# Patient Record
Sex: Male | Born: 2000
Health system: Southern US, Community
[De-identification: ages and names within clinical notes are randomized; demographics above are authoritative.]

## PROBLEM LIST (undated history)

## (undated) ENCOUNTER — Ambulatory Visit: Disposition: A | Discharge status: Home / Self Care | Payer: Medicaid Other

## (undated) DIAGNOSIS — A6000 Herpesviral infection of urogenital system, unspecified: Secondary | ICD-10-CM

---
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
## 2020-08-25 ENCOUNTER — Encounter (HOSPITAL_COMMUNITY): Payer: Self-pay | Admitting: *Deleted

## 2020-08-25 ENCOUNTER — Other Ambulatory Visit: Payer: Self-pay

## 2020-08-25 ENCOUNTER — Emergency Department (HOSPITAL_COMMUNITY)
Admission: EM | Admit: 2020-08-25 | Discharge: 2020-08-26 | Disposition: A | Discharge status: Home / Self Care | Payer: Medicaid Other | Attending: Emergency Medicine | Admitting: Emergency Medicine

## 2020-08-25 DIAGNOSIS — Z5321 Procedure and treatment not carried out due to patient leaving prior to being seen by health care provider: Secondary | ICD-10-CM | POA: Diagnosis not present

## 2020-08-25 DIAGNOSIS — R202 Paresthesia of skin: Secondary | ICD-10-CM | POA: Insufficient documentation

## 2020-08-25 DIAGNOSIS — M542 Cervicalgia: Secondary | ICD-10-CM | POA: Diagnosis present
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
## 2021-05-11 ENCOUNTER — Ambulatory Visit (HOSPITAL_COMMUNITY)
Admission: EM | Admit: 2021-05-11 | Discharge: 2021-05-11 | Disposition: A | Discharge status: Home / Self Care | Payer: Medicaid Other | Attending: Urgent Care | Admitting: Urgent Care

## 2021-05-11 ENCOUNTER — Ambulatory Visit (INDEPENDENT_AMBULATORY_CARE_PROVIDER_SITE_OTHER): Payer: Medicaid Other

## 2021-05-11 ENCOUNTER — Encounter (HOSPITAL_COMMUNITY): Payer: Self-pay

## 2021-05-11 DIAGNOSIS — R0781 Pleurodynia: Secondary | ICD-10-CM | POA: Diagnosis not present

## 2021-05-11 DIAGNOSIS — R1031 Right lower quadrant pain: Secondary | ICD-10-CM | POA: Diagnosis not present

## 2021-05-11 DIAGNOSIS — R0789 Other chest pain: Secondary | ICD-10-CM | POA: Diagnosis present

## 2021-05-11 DIAGNOSIS — R070 Pain in throat: Secondary | ICD-10-CM | POA: Insufficient documentation

## 2021-05-11 LAB — POCT RAPID STREP A, ED / UC: Streptococcus, Group A Screen (Direct): NEGATIVE

## 2021-05-11 MED ORDER — NAPROXEN 500 MG PO TABS: 500.0000 mg | ORAL_TABLET | Freq: Two times a day (BID) | ORAL | 0 refills | Status: DC

## 2021-05-11 MED ORDER — PHENOL 1.4 % MT LIQD: 1.0000 | Freq: Two times a day (BID) | OROMUCOSAL | 0 refills | Status: DC | PRN

## 2021-05-11 MED ORDER — TIZANIDINE HCL 4 MG PO TABS: 4.0000 mg | ORAL_TABLET | Freq: Every day | ORAL | 0 refills | Status: DC

## 2021-05-11 NOTE — ED Provider Notes (Signed)
[facility]   MRN: [id number] DOB: 07-Jan-2001  Subjective:   Henry Henderson is a 20 y.o. male presenting for 2-week history of persistent intermittent right lower rib pain.  Patient was playing basketball, ended up colliding with a player that hit him directly over this area.  His pain was initially severe and resolved for few days but returned in the past 2 to 3 days.  Pain is not elicited with taking a deep breath.  No fever, shortness of breath, bruising.  He has not used any medications consistently for this.  His mom did give him a muscle relaxer which helped some.  Yesterday he also started having chills and throat pain.  No current facility-administered medications for this encounter. No current outpatient medications on file.   No Known Allergies  History reviewed. No pertinent past medical history.   History reviewed. No pertinent surgical history.  History reviewed. No pertinent family history.  Social History   Tobacco Use   Smoking status: Never   Smokeless tobacco: Never  Substance Use Topics   Alcohol use: Yes   Drug use: Never    ROS   Objective:   Vitals: BP (!) 120/55 (BP Location: Right Arm)   Pulse 77   Temp 99.5 F (37.5 C) (Oral)   Resp 18   SpO2 99%   Physical Exam Constitutional:      General: He is not in acute distress.    Appearance: Normal appearance. He is well-developed. He is not ill-appearing, toxic-appearing or diaphoretic.  HENT:     Head: Normocephalic and atraumatic.     Right Ear: External ear normal.     Left Ear: External ear normal.     Nose: Nose normal.     Mouth/Throat:     Mouth: Mucous membranes are moist.     Pharynx: Oropharynx is clear. No pharyngeal swelling, oropharyngeal exudate, posterior oropharyngeal erythema or uvula swelling.  Eyes:     General: No scleral icterus.    Extraocular Movements: Extraocular movements intact.     Pupils: Pupils are equal, round, and reactive to light.   Cardiovascular:     Rate and Rhythm: Normal rate and regular rhythm.     Heart sounds: Normal heart sounds. No murmur heard.   No friction rub. No gallop.  Pulmonary:     Effort: Pulmonary effort is normal. No respiratory distress.     Breath sounds: Normal breath sounds. No stridor. No wheezing, rhonchi or rales.  Chest:     Chest wall: Tenderness (right lower-medial side) present.  Abdominal:     General: There is no distension.     Palpations: There is no mass.     Tenderness: no abdominal tenderness There is no guarding or rebound.  Neurological:     Mental Status: He is alert and oriented to person, place, and time.  Psychiatric:        Mood and Affect: Mood normal.        Behavior: Behavior normal.        Thought Content: Thought content normal.    Results for orders placed or performed during the hospital encounter of 05/11/21 (from the past 24 hour(s))  POCT Rapid Strep A     Status: None   Collection Time: 05/11/21  7:10 PM  Result Value Ref Range   Streptococcus, Group A Screen (Direct) NEGATIVE NEGATIVE    DG Ribs Unilateral W/Chest Right  Result Date: 05/11/2021 CLINICAL DATA:  Lower right rib pain  EXAM: RIGHT RIBS AND CHEST - 3+ VIEW COMPARISON:  None. FINDINGS: No fracture or other bone lesions are seen involving the ribs. There is no evidence of pneumothorax or pleural effusion. Both lungs are clear. Heart size and mediastinal contours are within normal limits. IMPRESSION: Negative. Electronically Signed   By: Charlett Nose M.D.   On: 05/11/2021 19:22     Assessment and Plan :   PDMP not reviewed this encounter.  1. Rib pain on right side   2. Chest wall pain   3. Throat pain     Recommended conservative management with NSAID, muscle relaxant.  Advised that he rest from sports for least a week.  Will use supportive care for his throat pain, suspect viral URI, viral pharyngitis.  Strep culture pending. Counseled patient on potential for adverse effects with  medications prescribed/recommended today, ER and return-to-clinic precautions discussed, patient verbalized understanding.    Wallis Bamberg, [place] 05/11/21 1931

## 2021-05-11 NOTE — ED Triage Notes (Addendum)
Pt in with c/o RUQ pain that started a few days ago after he was playing basketball. Pt also c/o ST  Pt states that when he breathes deeply he feels a sharp pain   Denies any N/v

## 2021-05-14 LAB — CULTURE, GROUP A STREP (THRC)

## 2021-05-19 ENCOUNTER — Emergency Department (HOSPITAL_COMMUNITY)
Admission: EM | Admit: 2021-05-19 | Discharge: 2021-05-20 | Disposition: A | Discharge status: Home / Self Care | Payer: Medicaid Other | Attending: Emergency Medicine | Admitting: Emergency Medicine

## 2021-05-19 DIAGNOSIS — W3400XA Accidental discharge from unspecified firearms or gun, initial encounter: Secondary | ICD-10-CM | POA: Diagnosis not present

## 2021-05-19 DIAGNOSIS — Z23 Encounter for immunization: Secondary | ICD-10-CM | POA: Diagnosis not present

## 2021-05-19 DIAGNOSIS — S81031A Puncture wound without foreign body, right knee, initial encounter: Secondary | ICD-10-CM | POA: Insufficient documentation

## 2021-05-19 DIAGNOSIS — S8991XA Unspecified injury of right lower leg, initial encounter: Secondary | ICD-10-CM | POA: Diagnosis present

## 2021-05-20 ENCOUNTER — Other Ambulatory Visit: Payer: Self-pay

## 2021-05-20 ENCOUNTER — Encounter (HOSPITAL_COMMUNITY): Payer: Self-pay | Admitting: Emergency Medicine

## 2021-05-20 ENCOUNTER — Encounter (HOSPITAL_COMMUNITY): Payer: Self-pay

## 2021-05-20 ENCOUNTER — Emergency Department (HOSPITAL_COMMUNITY): Payer: Medicaid Other

## 2021-05-20 LAB — COMPREHENSIVE METABOLIC PANEL
ALT: 18 U/L (ref 0–44)
AST: 23 U/L (ref 15–41)
Albumin: 4.3 g/dL (ref 3.5–5.0)
Alkaline Phosphatase: 50 U/L (ref 38–126)
Anion gap: 14 (ref 5–15)
BUN: 10 mg/dL (ref 6–20)
CO2: 18 mmol/L — ABNORMAL LOW (ref 22–32)
Calcium: 8.9 mg/dL (ref 8.9–10.3)
Chloride: 106 mmol/L (ref 98–111)
Creatinine, Ser: 1.25 mg/dL — ABNORMAL HIGH (ref 0.61–1.24)
GFR, Estimated: >60 mL/min (ref >60)
Glucose, Bld: 136 mg/dL — ABNORMAL HIGH (ref 70–99)
Potassium: 3.5 mmol/L (ref 3.5–5.1)
Sodium: 138 mmol/L (ref 135–145)
Total Bilirubin: 0.6 mg/dL (ref 0.3–1.2)
Total Protein: 6.9 g/dL (ref 6.5–8.1)

## 2021-05-20 LAB — URINALYSIS, ROUTINE W REFLEX MICROSCOPIC
Bilirubin Urine: NEGATIVE
Glucose, UA: NEGATIVE mg/dL
Hgb urine dipstick: NEGATIVE
Ketones, ur: NEGATIVE mg/dL
Leukocytes,Ua: NEGATIVE
Nitrite: NEGATIVE
Protein, ur: NEGATIVE mg/dL
Specific Gravity, Urine: 1.017 (ref 1.005–1.030)
pH: 7 (ref 5.0–8.0)

## 2021-05-20 LAB — CBC
HCT: 43.8 % (ref 39.0–52.0)
Hemoglobin: 14.3 g/dL (ref 13.0–17.0)
MCH: 31.3 pg (ref 26.0–34.0)
MCHC: 32.6 g/dL (ref 30.0–36.0)
MCV: 95.8 fL (ref 80.0–100.0)
Platelets: 322 10*3/uL (ref 150–400)
RBC: 4.57 MIL/uL (ref 4.22–5.81)
RDW: 13.1 % (ref 11.5–15.5)
WBC: 7.2 10*3/uL (ref 4.0–10.5)
nRBC: 0 % (ref 0.0–0.2)

## 2021-05-20 LAB — I-STAT CHEM 8, ED
BUN: 11 mg/dL (ref 6–20)
Calcium, Ion: 1.13 mmol/L — ABNORMAL LOW (ref 1.15–1.40)
Chloride: 107 mmol/L (ref 98–111)
Creatinine, Ser: 1.2 mg/dL (ref 0.61–1.24)
Glucose, Bld: 129 mg/dL — ABNORMAL HIGH (ref 70–99)
HCT: 45 % (ref 39.0–52.0)
Hemoglobin: 15.3 g/dL (ref 13.0–17.0)
Potassium: 3.5 mmol/L (ref 3.5–5.1)
Sodium: 142 mmol/L (ref 135–145)
TCO2: 19 mmol/L — ABNORMAL LOW (ref 22–32)

## 2021-05-20 LAB — SAMPLE TO BLOOD BANK

## 2021-05-20 MED ORDER — HYDROMORPHONE HCL 1 MG/ML IJ SOLN
1.0000 mg | Freq: Once | INTRAMUSCULAR | Status: AC
Start: 2021-05-20 — End: 2021-05-20
  Administered 2021-05-20: 1 mg via INTRAVENOUS
  Filled 2021-05-20: qty 1

## 2021-05-20 MED ORDER — CEFAZOLIN SODIUM-DEXTROSE 2-4 GM/100ML-% IV SOLN
2.0000 g | Freq: Once | INTRAVENOUS | Status: AC
  Administered 2021-05-20: 2 g via INTRAVENOUS
  Filled 2021-05-20: qty 100

## 2021-05-20 MED ORDER — ONDANSETRON HCL 4 MG/2ML IJ SOLN
4.0000 mg | Freq: Once | INTRAMUSCULAR | Status: AC | PRN
  Administered 2021-05-20: 4 mg via INTRAVENOUS
  Filled 2021-05-20: qty 2

## 2021-05-20 MED ORDER — TETANUS-DIPHTH-ACELL PERTUSSIS 5-2.5-18.5 LF-MCG/0.5 IM SUSY
0.5000 mL | PREFILLED_SYRINGE | Freq: Once | INTRAMUSCULAR | Status: AC
  Administered 2021-05-20: 0.5 mL via INTRAMUSCULAR
  Filled 2021-05-20: qty 0.5

## 2021-05-20 MED ORDER — IOHEXOL 350 MG/ML SOLN
100.0000 mL | Freq: Once | INTRAVENOUS | Status: AC | PRN
  Administered 2021-05-20: 100 mL via INTRAVENOUS

## 2021-05-20 MED ORDER — CEPHALEXIN 500 MG PO CAPS: 500.0000 mg | ORAL_CAPSULE | Freq: Four times a day (QID) | ORAL | 0 refills | Status: AC

## 2021-05-20 MED ORDER — OXYCODONE-ACETAMINOPHEN 5-325 MG PO TABS: 1.0000 | ORAL_TABLET | Freq: Four times a day (QID) | ORAL | 0 refills | Status: DC | PRN

## 2021-05-20 MED ORDER — SODIUM CHLORIDE 0.9 % IV BOLUS
2000.0000 mL | Freq: Once | INTRAVENOUS | Status: AC
  Administered 2021-05-20: 2000 mL via INTRAVENOUS
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
## 2021-05-20 NOTE — ED Notes (Signed)
Pt brother Daheem,[place] called
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
## 2021-05-20 NOTE — Discharge Instructions (Addendum)
Take Keflex as prescribed. You have been prescribed Percocet to take as needed for severe pain.  Do not drive or drink alcohol after taking this medication as it may make you drowsy and impair your judgment.    Avoid soaking your wound in stagnant or dirty water such as while taking a bath. You can shower normally. Keep the area clean with mild soap and warm water. Do not apply peroxide or alcohol to your wound as this can break down newly forming skin and prolong wound healing. Keep the area bandaged and change the dressing/bandage at least once per day. Have your staples/sutures removed in 7-10 days. You should return for wound recheck in 48 hours.
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
## 2021-05-25 ENCOUNTER — Encounter (HOSPITAL_COMMUNITY): Payer: Self-pay

## 2021-05-25 ENCOUNTER — Emergency Department (HOSPITAL_COMMUNITY)
Admission: EM | Admit: 2021-05-25 | Discharge: 2021-05-25 | Disposition: A | Discharge status: Home / Self Care | Payer: Medicaid Other | Attending: Emergency Medicine | Admitting: Emergency Medicine

## 2021-05-25 ENCOUNTER — Other Ambulatory Visit: Payer: Self-pay

## 2021-05-25 DIAGNOSIS — S81811D Laceration without foreign body, right lower leg, subsequent encounter: Secondary | ICD-10-CM | POA: Insufficient documentation

## 2021-05-25 DIAGNOSIS — W3400XD Accidental discharge from unspecified firearms or gun, subsequent encounter: Secondary | ICD-10-CM | POA: Diagnosis not present

## 2021-05-25 DIAGNOSIS — Z5189 Encounter for other specified aftercare: Secondary | ICD-10-CM
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
## 2021-05-25 NOTE — Discharge Instructions (Addendum)
Your wounds appear to be healing well, please continue the antibiotics that were prescribed to you.  I want you to rinse out the wounds 2 times daily and change the dressings.  You may apply antibiotic ointment over the area after each dressing change.  I wanted you to follow-up with wound care, given the contact of range above please call.  Want you to follow-up with a PCP.  Come back to the emergency department if you develop chest pain, shortness of breath, severe abdominal pain, uncontrolled nausea, vomiting, diarrhea.

## 2021-06-10 ENCOUNTER — Encounter (HOSPITAL_BASED_OUTPATIENT_CLINIC_OR_DEPARTMENT_OTHER): Payer: Medicaid Other | Attending: Internal Medicine | Admitting: Internal Medicine

## 2021-07-07 ENCOUNTER — Encounter (HOSPITAL_BASED_OUTPATIENT_CLINIC_OR_DEPARTMENT_OTHER): Payer: Medicaid Other | Attending: Internal Medicine | Admitting: Internal Medicine

## 2021-07-07 ENCOUNTER — Other Ambulatory Visit: Payer: Self-pay

## 2021-07-07 DIAGNOSIS — S81801A Unspecified open wound, right lower leg, initial encounter: Secondary | ICD-10-CM | POA: Insufficient documentation

## 2021-07-07 DIAGNOSIS — S81039A Puncture wound without foreign body, unspecified knee, initial encounter: Secondary | ICD-10-CM | POA: Diagnosis present

## 2021-07-12 NOTE — Progress Notes (Signed)
Henry Henderson ([id number]) Visit Report for 07/07/2021 Allergy List Details Patient Name: Date of Service: Henry Henderson [name] 07/07/2021 9:00 A M Medical Record Number: [id number] Patient Account Number: [id number] Date of Birth/Sex: Treating RN: [birth year] (20 y.o. Male) Fonnie Mu Primary Care Fowler Antos: PCP, NO Other Clinician: Referring Jozsef Wescoat: Treating Laiyah Exline/Extender: Tilda Franco in Treatment: 0 Allergies Active Allergies No Known Allergies Allergy Notes Electronic Signature(s) Signed: 07/12/2021 5:24:19 PM By: Fonnie Mu RN Entered By: Fonnie Mu on 07/07/2021 09:38:54 -------------------------------------------------------------------------------- Arrival Information Details Patient Name: Date of Service: Henry Henderson, O [name] 07/07/2021 9:00 A M Medical Record Number: [id number] Patient Account Number: [id number] Date of Birth/Sex: Treating RN: August 26, 2001 (20 y.o. Male) Fonnie Mu Primary Care Ajiah Mcglinn: PCP, NO Other Clinician: Referring Terrina Docter: Treating Sinahi Knights/Extender: Tilda Franco in Treatment: 0 Visit Information Patient Arrived: Ambulatory Arrival Time: 09:37 Accompanied By: sister Transfer Assistance: None Patient Identification Verified: Yes Secondary Verification Process Completed: Yes Patient Requires Transmission-Based Precautions: No Patient Has Alerts: No Electronic Signature(s) Signed: 07/07/2021 4:57:56 PM By: Zandra Abts RN, BSN Entered By: Zandra Abts on 07/07/2021 09:41:19 -------------------------------------------------------------------------------- Clinic Level of Care Assessment Details Patient Name: Date of Service: Henry Henderson [name] 07/07/2021 9:00 A M Medical Record Number: [id number] Patient Account Number: [id number] Date of Birth/Sex: Treating RN: September 17, 2001 (20 y.o. Male) Zandra Abts Primary Care Angelette Ganus: PCP, NO Other Clinician: Referring Demarques Pilz: Treating Keygan Dumond/Extender:  Tilda Franco in Treatment: 0 Clinic Level of Care Assessment Items TOOL 2 Quantity Score X- 1 0 Use when only an EandM is performed on the INITIAL visit ASSESSMENTS - Nursing Assessment / Reassessment X- 1 20 General Physical Exam (combine w/ comprehensive assessment (listed just below) when performed on new pt. evals) X- 1 25 Comprehensive Assessment (HX, ROS, Risk Assessments, Wounds Hx, etc.) ASSESSMENTS - Wound and Skin A ssessment / Reassessment []  - 0 Simple Wound Assessment / Reassessment - one wound X- 2 5 Complex Wound Assessment / Reassessment - multiple wounds []  - 0 Dermatologic / Skin Assessment (not related to wound area) ASSESSMENTS - Ostomy and/or Continence Assessment and Care []  - 0 Incontinence Assessment and Management []  - 0 Ostomy Care Assessment and Management (repouching, etc.) PROCESS - Coordination of Care X - Simple Patient / Family Education for ongoing care 1 15 []  - 0 Complex (extensive) Patient / Family Education for ongoing care X- 1 10 Staff obtains , Records, T Results / Process Orders est []  - 0 Staff telephones HHA, Nursing Homes / Clarify orders / etc []  - 0 Routine Transfer to another Facility (non-emergent condition) []  - 0 Routine Hospital Admission (non-emergent condition) X- 1 15 New Admissions / / Ordering NPWT Apligraf, etc. , []  - 0 Emergency Hospital Admission (emergent condition) X- 1 10 Simple Discharge Coordination []  - 0 Complex (extensive) Discharge Coordination PROCESS - Special Needs []  - 0 Pediatric / Minor Patient Management []  - 0 Isolation Patient Management []  - 0 Hearing / Language / Visual special needs []  - 0 Assessment of Community assistance (transportation, D/C planning, etc.) []  - 0 Additional assistance / Altered mentation []  - 0 Support Surface(s) Assessment (bed, cushion, seat, etc.) INTERVENTIONS - Wound Cleansing / Measurement X- 1 5 Wound  Imaging (photographs - any number of wounds) []  - 0 Wound Tracing (instead of photographs) []  - 0 Simple Wound Measurement - one wound X- 2 5 Complex Wound Measurement - multiple wounds []  - 0 Simple Wound Cleansing - one wound X- 2 5 Complex Wound Cleansing -  multiple wounds INTERVENTIONS - Wound Dressings []  - 0 Small Wound Dressing one or multiple wounds X- 1 15 Medium Wound Dressing one or multiple wounds []  - 0 Large Wound Dressing one or multiple wounds []  - 0 Application of Medications - injection INTERVENTIONS - Miscellaneous []  - 0 External ear exam []  - 0 Specimen Collection (cultures, biopsies, blood, body fluids, etc.) []  - 0 Specimen(s) / Culture(s) sent or taken to Lab for analysis []  - 0 Patient Transfer (multiple staff / Michiel SitesHoyer Lift / Similar devices) []  - 0 Simple Staple / Suture removal (25 or less) []  - 0 Complex Staple / Suture removal (26 or more) []  - 0 Hypo / Hyperglycemic Management (close monitor of Blood Glucose) []  - 0 Ankle / Brachial Index (ABI) - do not check if billed separately Has the patient been seen at the hospital within the last three years: Yes Total Score: 145 Level Of Care: New/Established - Level 4 Electronic Signature(s) Signed: 07/07/2021 4:57:56 PM By: Zandra AbtsLynch, Shatara RN, BSN Entered By: Zandra AbtsLynch, Shatara on 07/07/2021 09:59:11 -------------------------------------------------------------------------------- Encounter Discharge Information Details Patient Name: Date of Service: Henry Henderson, O [name] 07/07/2021 9:00 A M Medical Record Number: [id number] Patient Account Number: [id number] Date of Birth/Sex: Treating RN: 06/10/2001 (20 y.o. Male) Fonnie MuBreedlove, Lauren Primary Care Zakya Halabi: PCP, NO Other Clinician: Referring Trevon Strothers: Treating Marsi Turvey/Extender: Tilda FrancoHoffman, Jessica Weeks in Treatment: 0 Encounter Discharge Information Items Discharge Condition: Stable Ambulatory Status: Wheelchair Discharge Destination:  Home Transportation: Private Auto Accompanied By: self Schedule Follow-up Appointment: Yes Clinical Summary of Care: Patient Declined Electronic Signature(s) Signed: 07/12/2021 5:24:19 PM By: Fonnie MuBreedlove, Lauren RN Entered By: Fonnie MuBreedlove, Lauren on 07/07/2021 10:57:26 -------------------------------------------------------------------------------- Lower Extremity Assessment Details Patient Name: Date of Service: Henry Maryjean KaSSER, O [name] 07/07/2021 9:00 A M Medical Record Number: [id number] Patient Account Number: [id number] Date of Birth/Sex: Treating RN: 12/22/2000 (20 y.o. Male) Fonnie MuBreedlove, Lauren Primary Care Hannahgrace Lalli: PCP, NO Other Clinician: Referring Ladelle Teodoro: Treating Minh Roanhorse/Extender: Tilda FrancoHoffman, Jessica Weeks in Treatment: 0 Electronic Signature(s) Signed: 07/12/2021 5:24:19 PM By: Fonnie MuBreedlove, Lauren RN Signed: 07/12/2021 5:24:19 PM By: Fonnie MuBreedlove, Lauren RN Entered By: Fonnie MuBreedlove, Lauren on 07/07/2021 09:40:36 -------------------------------------------------------------------------------- Multi Wound Chart Details Patient Name: Date of Service: Henry Henderson, O [name] 07/07/2021 9:00 A M Medical Record Number: [id number] Patient Account Number: [id number] Date of Birth/Sex: Treating RN: 10/13/2001 (20 y.o. Male) Antonieta IbaBarnhart, Jodi Primary Care Malyah Ohlrich: PCP, NO Other Clinician: Referring Halcyon Heck: Treating Steen Bisig/Extender: Tilda FrancoHoffman, Jessica Weeks in Treatment: 0 Vital Signs Height(in): 72 Pulse(bpm): 79 Weight(lbs): Blood Pressure(mmHg): 119/74 Body Mass Index(BMI): Temperature(F): 98.5 Respiratory Rate(breaths/min): 16 Photos: [N/A:N/A] Right, Anterior Knee Right, Medial Knee N/A Wound Location: Trauma Trauma N/A Wounding Event: Trauma, Other Trauma, Other N/A Primary Etiology: 05/19/2021 05/19/2021 N/A Date Acquired: 0 0 N/A Weeks of Treatment: Open Open N/A Wound Status: 4.5x1x0.1 1x0.5x0.1 N/A Measurements L x W x D (cm) 3.534 0.393 N/A A (cm) : rea 0.353 0.039 N/A Volume  (cm) : N/A 0.00% N/A % Reduction in A rea: N/A 0.00% N/A % Reduction in Volume: Full Thickness Without Exposed Full Thickness Without Exposed N/A Classification: Support Structures Support Structures Medium Medium N/A Exudate Amount: Serosanguineous Serosanguineous N/A Exudate Type: red, brown red, brown N/A Exudate Color: Flat and Intact Flat and Intact N/A Wound Margin: Large (67-100%) Large (67-100%) N/A Granulation Amount: Red, Hyper-granulation Red, Hyper-granulation N/A Granulation Quality: None Present (0%) None Present (0%) N/A Necrotic Amount: Fat Layer (Subcutaneous Tissue): Yes Fat Layer (Subcutaneous Tissue): Yes N/A Exposed Structures: Fascia: No Fascia: No Tendon: No Tendon: No Muscle: No  Muscle: No Joint: No Joint: No Bone: No Bone: No None None N/A Epithelialization: Treatment Notes Electronic Signature(s) Signed: 07/07/2021 10:13:54 AM By: Geralyn Corwin DO Signed: 07/07/2021 5:23:24 PM By: Antonieta Iba Entered By: Geralyn Corwin on 07/07/2021 10:07:00 -------------------------------------------------------------------------------- Multi-Disciplinary Care Plan Details Patient Name: Date of Service: Henry Henderson, O [name] 07/07/2021 9:00 A M Medical Record Number: [id number] Patient Account Number: [id number] Date of Birth/Sex: Treating RN: 07-10-2001 (20 y.o. Male) Zandra Abts Primary Care Dashawna Delbridge: PCP, NO Other Clinician: Referring Houston Zapien: Treating Tawana Pasch/Extender: Tilda Franco in Treatment: 0 Multidisciplinary Care Plan reviewed with physician Active Inactive Wound/Skin Impairment Nursing Diagnoses: Impaired tissue integrity Knowledge deficit related to ulceration/compromised skin integrity Goals: Patient/caregiver will verbalize understanding of skin care regimen Date Initiated: 07/07/2021 Target Resolution Date: 08/05/2021 Goal Status: Active Ulcer/skin breakdown will have a volume reduction of 30% by week 4 Date  Initiated: 07/07/2021 Target Resolution Date: 08/05/2021 Goal Status: Active Interventions: Assess patient/caregiver ability to obtain necessary supplies Assess patient/caregiver ability to perform ulcer/skin care regimen upon admission and as needed Assess ulceration(s) every visit Provide education on ulcer and skin care Notes: Electronic Signature(s) Signed: 07/07/2021 4:57:56 PM By: Zandra Abts RN, BSN Entered By: Zandra Abts on 07/07/2021 09:58:11 -------------------------------------------------------------------------------- Pain Assessment Details Patient Name: Date of Service: Henry Henderson [name] 07/07/2021 9:00 A M Medical Record Number: [id number] Patient Account Number: [id number] Date of Birth/Sex: Treating RN: Mar 07, 2001 (20 y.o. Male) Zandra Abts Primary Care Achaia Garlock: PCP, NO Other Clinician: Referring Anakaren Campion: Treating Itzel Mckibbin/Extender: Tilda Franco in Treatment: 0 Active Problems Location of Pain Severity and Description of Pain Patient Has Paino No Site Locations Pain Management and Medication Current Pain Management: Electronic Signature(s) Signed: 07/07/2021 4:57:56 PM By: Zandra Abts RN, BSN Entered By: Zandra Abts on 07/07/2021 09:42:59 -------------------------------------------------------------------------------- Patient/Caregiver Education Details Patient Name: Date of Service: Henry Henderson [name] 8/25/2022andnbsp9:00 A M Medical Record Number: [id number] Patient Account Number: [id number] Date of Birth/Gender: Treating RN: 27-Dec-2000 (20 y.o. Male) Zandra Abts Primary Care Physician: PCP, NO Other Clinician: Referring Physician: Treating Physician/Extender: Tilda Franco in Treatment: 0 Education Assessment Education Provided To: Patient Education Topics Provided Wound/Skin Impairment: Methods: Explain/Verbal Responses: State content correctly Electronic Signature(s) Signed: 07/07/2021 4:57:56 PM By: Zandra Abts RN, BSN Entered By: Zandra Abts on 07/07/2021 09:58:19 -------------------------------------------------------------------------------- Wound Assessment Details Patient Name: Date of Service: Henry Henderson [name] 07/07/2021 9:00 A M Medical Record Number: [id number] Patient Account Number: [id number] Date of Birth/Sex: Treating RN: 02-27-2001 (20 y.o. Male) Antonieta Iba Primary Care Ruth Kovich: PCP, NO Other Clinician: Referring Kimani Hovis: Treating Keith Felten/Extender: Tilda Franco in Treatment: 0 Wound Status Wound Number: 1 Primary Etiology: Trauma, Other Wound Location: Right, Anterior Knee Wound Status: Open Wounding Event: Trauma Date Acquired: 05/19/2021 Weeks Of Treatment: 0 Clustered Wound: No Photos Wound Measurements Length: (cm) 4.5 Width: (cm) 1 Depth: (cm) 0.1 Area: (cm) 3.534 Volume: (cm) 0.353 % Reduction in Area: % Reduction in Volume: Epithelialization: None Tunneling: No Undermining: No Wound Description Classification: Full Thickness Without Exposed Support Structures Wound Margin: Flat and Intact Exudate Amount: Medium Exudate Type: Serosanguineous Exudate Color: red, brown Foul Odor After Cleansing: No Slough/Fibrino No Wound Bed Granulation Amount: Large (67-100%) Exposed Structure Granulation Quality: Red, Hyper-granulation Fascia Exposed: No Necrotic Amount: None Present (0%) Fat Layer (Subcutaneous Tissue) Exposed: Yes Tendon Exposed: No Muscle Exposed: No Joint Exposed: No Bone Exposed: No Treatment Notes Wound #1 (Knee) Wound Laterality: Right, Anterior Cleanser Soap and Water Discharge Instruction: May shower and wash wound with  dial antibacterial soap and water prior to dressing change. Peri-Wound Care Topical Primary Dressing Hydrofera Blue Ready Foam, 2.5 x2.5 in Discharge Instruction: Apply to wound bed as instructed Secondary Dressing Woven Gauze Sponge, Non-Sterile 4x4 in Discharge Instruction: Apply over  primary dressing as directed. ABD Pad, 5x9 Discharge Instruction: Apply over primary dressing as directed. Secured With Elastic Bandage 4 inch (ACE bandage) Discharge Instruction: Secure with ACE bandage as directed. Kerlix Roll Sterile, 4.5x3.1 (in/yd) Discharge Instruction: Secure with Kerlix as directed. Compression Wrap Compression Stockings Add-Ons Electronic Signature(s) Signed: 07/07/2021 4:57:56 PM By: Zandra Abts RN, BSN Signed: 07/07/2021 5:23:24 PM By: Antonieta Iba Entered By: Zandra Abts on 07/07/2021 09:50:33 -------------------------------------------------------------------------------- Wound Assessment Details Patient Name: Date of Service: Henry Henderson, O [name] 07/07/2021 9:00 A M Medical Record Number: [id number] Patient Account Number: [id number] Date of Birth/Sex: Treating RN: [birth year] (20 y.o. Male) Zandra Abts Primary Care Alphonso Gregson: PCP, NO Other Clinician: Referring Naol Ontiveros: Treating Standley Bargo/Extender: Tilda Franco in Treatment: 0 Wound Status Wound Number: 2 Primary Etiology: Trauma, Other Wound Location: Right, Medial Knee Wound Status: Open Wounding Event: Trauma Date Acquired: 05/19/2021 Weeks Of Treatment: 0 Clustered Wound: No Photos Wound Measurements Length: (cm) 1 Width: (cm) 0.5 Depth: (cm) 0.1 Area: (cm) 0.393 Volume: (cm) 0.039 % Reduction in Area: 0% % Reduction in Volume: 0% Epithelialization: None Tunneling: No Undermining: No Wound Description Classification: Full Thickness Without Exposed Support Structures Wound Margin: Flat and Intact Exudate Amount: Medium Exudate Type: Serosanguineous Exudate Color: red, brown Foul Odor After Cleansing: No Slough/Fibrino No Wound Bed Granulation Amount: Large (67-100%) Exposed Structure Granulation Quality: Red, Hyper-granulation Fascia Exposed: No Necrotic Amount: None Present (0%) Fat Layer (Subcutaneous Tissue) Exposed: Yes Tendon Exposed: No Muscle Exposed:  No Joint Exposed: No Bone Exposed: No Treatment Notes Wound #2 (Knee) Wound Laterality: Right, Medial Cleanser Soap and Water Discharge Instruction: May shower and wash wound with dial antibacterial soap and water prior to dressing change. Peri-Wound Care Topical Primary Dressing Hydrofera Blue Ready Foam, 2.5 x2.5 in Discharge Instruction: Apply to wound bed as instructed Secondary Dressing Woven Gauze Sponge, Non-Sterile 4x4 in Discharge Instruction: Apply over primary dressing as directed. ABD Pad, 5x9 Discharge Instruction: Apply over primary dressing as directed. Secured With Elastic Bandage 4 inch (ACE bandage) Discharge Instruction: Secure with ACE bandage as directed. Kerlix Roll Sterile, 4.5x3.1 (in/yd) Discharge Instruction: Secure with Kerlix as directed. Compression Wrap Compression Stockings Add-Ons Electronic Signature(s) Signed: 07/07/2021 4:57:56 PM By: Zandra Abts RN, BSN Signed: 07/11/2021 3:13:54 PM By: Karl Ito Entered By: Karl Ito on 07/07/2021 09:50:17 -------------------------------------------------------------------------------- Vitals Details Patient Name: Date of Service: Henry Henderson, O [name] 07/07/2021 9:00 A M Medical Record Number: [id number] Patient Account Number: [id number] Date of Birth/Sex: Treating RN: July 14, 2001 (20 y.o. Male) Fonnie Mu Primary Care Dashley Monts: PCP, NO Other Clinician: Referring Deshaun Schou: Treating Aynsley Fleet/Extender: Tilda Franco in Treatment: 0 Vital Signs Time Taken: 09:38 Temperature (F): 98.5 Height (in): 72 Pulse (bpm): 79 Source: Stated Respiratory Rate (breaths/min): 16 Blood Pressure (mmHg): 119/74 Reference Range: 80 - 120 mg / dl Electronic Signature(s) Signed: 07/07/2021 4:57:56 PM By: Zandra Abts RN, BSN Entered By: Zandra Abts on 07/07/2021 09:51:26
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
## 2021-07-21 ENCOUNTER — Ambulatory Visit: Payer: Medicaid Other | Attending: Internal Medicine | Admitting: Physical Therapy

## 2021-07-21 ENCOUNTER — Encounter (HOSPITAL_BASED_OUTPATIENT_CLINIC_OR_DEPARTMENT_OTHER): Payer: Medicaid Other | Attending: Internal Medicine | Admitting: Internal Medicine

## 2021-07-21 ENCOUNTER — Other Ambulatory Visit: Payer: Self-pay

## 2021-07-21 DIAGNOSIS — M25661 Stiffness of right knee, not elsewhere classified: Secondary | ICD-10-CM | POA: Insufficient documentation

## 2021-07-21 DIAGNOSIS — S81001A Unspecified open wound, right knee, initial encounter: Secondary | ICD-10-CM | POA: Insufficient documentation

## 2021-07-21 DIAGNOSIS — R262 Difficulty in walking, not elsewhere classified: Secondary | ICD-10-CM | POA: Insufficient documentation

## 2021-07-21 DIAGNOSIS — R600 Localized edema: Secondary | ICD-10-CM | POA: Insufficient documentation

## 2021-07-21 DIAGNOSIS — M25561 Pain in right knee: Secondary | ICD-10-CM | POA: Insufficient documentation

## 2021-07-21 DIAGNOSIS — S81801D Unspecified open wound, right lower leg, subsequent encounter: Secondary | ICD-10-CM

## 2021-07-21 NOTE — Progress Notes (Addendum)
RESHARD, GUILLET ([id number]) Visit Report for 07/21/2021 Arrival Information Details Patient Name: Date of Service: Henry Henderson RLA Henderson 07/21/2021 9:30 A M Medical Record Number: [id number] Patient Account Number: [id number] Date of Birth/Sex: Treating RN: 2000-12-12 (20 y.Henry. Charlean Merl, Lauren Primary Care Shametra Cumberland: PCP, NO Other Clinician: Referring Marquett Bertoli: Treating Christophere Hillhouse/Extender: Tilda Franco in Treatment: 2 Visit Information History Since Last Visit Added or deleted any medications: No Patient Arrived: Crutches Any new allergies or adverse reactions: No Arrival Time: 09:22 Had a fall or experienced change in No Accompanied By: sister activities of daily living that may affect Transfer Assistance: None risk of falls: Patient Identification Verified: Yes Signs or symptoms of abuse/neglect since last visito No Secondary Verification Process Completed: Yes Hospitalized since last visit: No Patient Requires Transmission-Based Precautions: No Implantable device outside of the clinic excluding No Patient Has Alerts: No cellular tissue based products placed in the center since last visit: Has Dressing in Place as Prescribed: Yes Pain Present Now: No Electronic Signature(s) Signed: 07/21/2021 6:08:32 PM By: Fonnie Mu RN Entered By: Fonnie Mu on 07/21/2021 09:22:55 -------------------------------------------------------------------------------- Clinic Level of Care Assessment Details Patient Name: Date of Service: Henry Henderson RLA Henderson 07/21/2021 9:30 A M Medical Record Number: [id number] Patient Account Number: [id number] Date of Birth/Sex: Treating RN: [birth year] (20 y.Henry. Charlean Merl, Lauren Primary Care Kerrick Miler: PCP, NO Other Clinician: Referring Fara Worthy: Treating Sindia Kowalczyk/Extender: Tilda Franco in Treatment: 2 Clinic Level of Care Assessment Items TOOL 4 Quantity Score X- 1 0 Use when only an EandM is performed on FOLLOW-UP  visit ASSESSMENTS - Nursing Assessment / Reassessment X- 1 10 Reassessment of Co-morbidities (includes updates in patient status) X- 1 5 Reassessment of Adherence to Treatment Plan ASSESSMENTS - Wound and Skin A ssessment / Reassessment X - Simple Wound Assessment / Reassessment - one wound 1 5 []  - 0 Complex Wound Assessment / Reassessment - multiple wounds X- 1 10 Dermatologic / Skin Assessment (not related to wound area) ASSESSMENTS - Focused Assessment []  - 0 Circumferential Edema Measurements - multi extremities []  - 0 Nutritional Assessment / Counseling / Intervention []  - 0 Lower Extremity Assessment (monofilament, tuning fork, pulses) []  - 0 Peripheral Arterial Disease Assessment (using hand held doppler) ASSESSMENTS - Ostomy and/or Continence Assessment and Care []  - 0 Incontinence Assessment and Management []  - 0 Ostomy Care Assessment and Management (repouching, etc.) PROCESS - Coordination of Care X - Simple Patient / Family Education for ongoing care 1 15 []  - 0 Complex (extensive) Patient / Family Education for ongoing care X- 1 10 Staff obtains , Records, T Results / Process Orders est []  - 0 Staff telephones HHA, Nursing Homes / Clarify orders / etc []  - 0 Routine Transfer to another Facility (non-emergent condition) []  - 0 Routine Hospital Admission (non-emergent condition) []  - 0 New Admissions / / Ordering NPWT Apligraf, etc. , []  - 0 Emergency Hospital Admission (emergent condition) X- 1 10 Simple Discharge Coordination []  - 0 Complex (extensive) Discharge Coordination PROCESS - Special Needs []  - 0 Pediatric / Minor Patient Management []  - 0 Isolation Patient Management []  - 0 Hearing / Language / Visual special needs []  - 0 Assessment of Community assistance (transportation, D/C planning, etc.) []  - 0 Additional assistance / Altered mentation []  - 0 Support Surface(s) Assessment (bed, cushion, seat,  etc.) INTERVENTIONS - Wound Cleansing / Measurement X - Simple Wound Cleansing - one wound 1 5 []  - 0 Complex Wound Cleansing - multiple wounds X- 1  5 Wound Imaging (photographs - any number of wounds) []  - 0 Wound Tracing (instead of photographs) X- 1 5 Simple Wound Measurement - one wound []  - 0 Complex Wound Measurement - multiple wounds INTERVENTIONS - Wound Dressings X - Small Wound Dressing one or multiple wounds 1 10 []  - 0 Medium Wound Dressing one or multiple wounds []  - 0 Large Wound Dressing one or multiple wounds []  - 0 Application of Medications - topical []  - 0 Application of Medications - injection INTERVENTIONS - Miscellaneous []  - 0 External ear exam []  - 0 Specimen Collection (cultures, biopsies, blood, body fluids, etc.) []  - 0 Specimen(s) / Culture(s) sent or taken to Lab for analysis []  - 0 Patient Transfer (multiple staff / Nurse, adultHoyer Lift / Similar devices) []  - 0 Simple Staple / Suture removal (25 or less) []  - 0 Complex Staple / Suture removal (26 or more) []  - 0 Hypo / Hyperglycemic Management (close monitor of Blood Glucose) []  - 0 Ankle / Brachial Index (ABI) - do not check if billed separately X- 1 5 Vital Signs Has the patient been seen at the hospital within the last three years: Yes Total Score: 95 Level Of Care: New/Established - Level 3 Electronic Signature(s) Signed: 07/21/2021 6:08:32 PM By: Fonnie MuBreedlove, Lauren RN Entered By: Fonnie MuBreedlove, Lauren on 07/21/2021 09:45:34 -------------------------------------------------------------------------------- Encounter Discharge Information Details Patient Name: Date of Service: Henry Arne ClevelandSSER, Henry Henderson 07/21/2021 9:30 A M Medical Record Number: [id number] Patient Account Number: [id number] Date of Birth/Sex: Treating RN: 02/15/2001 (20 y.Henry. Lucious GrovesM) Breedlove, Lauren Primary Care Candise Crabtree: PCP, NO Other Clinician: Referring Albert Hersch: Treating Mckay Tegtmeyer/Extender: Tilda FrancoHoffman, Jessica Weeks in Treatment: 2 Encounter  Discharge Information Items Discharge Condition: Stable Ambulatory Status: Crutches Discharge Destination: Home Transportation: Private Auto Accompanied By: self Schedule Follow-up Appointment: Yes Clinical Summary of Care: Patient Declined Electronic Signature(s) Signed: 07/21/2021 6:08:32 PM By: Fonnie MuBreedlove, Lauren RN Entered By: Fonnie MuBreedlove, Lauren on 07/21/2021 09:53:47 -------------------------------------------------------------------------------- Lower Extremity Assessment Details Patient Name: Date of Service: Henry Maryjean KaSSER, Henry Henderson 07/21/2021 9:30 A M Medical Record Number: [id number] Patient Account Number: [id number] Date of Birth/Sex: Treating RN: 09/26/2001 (20 y.Henry. Lucious GrovesM) Breedlove, Lauren Primary Care Norvel Wenker: PCP, NO Other Clinician: Referring Loleta Frommelt: Treating Hadley Soileau/Extender: Tilda FrancoHoffman, Jessica Weeks in Treatment: 2 Electronic Signature(s) Signed: 07/21/2021 6:08:32 PM By: Fonnie MuBreedlove, Lauren RN Entered By: Fonnie MuBreedlove, Lauren on 07/21/2021 09:24:53 -------------------------------------------------------------------------------- Multi Wound Chart Details Patient Name: Date of Service: Henry Arne ClevelandSSER, Henry Henderson 07/21/2021 9:30 A M Medical Record Number: [id number] Patient Account Number: [id number] Date of Birth/Sex: Treating RN: 04/20/2001 (20 y.Henry. Charlean MerlM) Breedlove, Lauren Primary Care Caidyn Blossom: PCP, NO Other Clinician: Referring Winfred Iiams: Treating Glennys Schorsch/Extender: Tilda FrancoHoffman, Jessica Weeks in Treatment: 2 Vital Signs Height(in): 72 Pulse(bpm): 60 Weight(lbs): Blood Pressure(mmHg): 96/64 Body Mass Index(BMI): Temperature(F): 98.4 Respiratory Rate(breaths/min): 17 Photos: [N/A:N/A] Right, Anterior Knee Right, Medial Knee N/A Wound Location: Trauma Trauma N/A Wounding Event: Trauma, Other Trauma, Other N/A Primary Etiology: 05/19/2021 05/19/2021 N/A Date Acquired: 2 2 N/A Weeks of Treatment: Open Healed - Epithelialized N/A Wound Status: 0.2x0.2x0.1 0x0x0 N/A Measurements L x W x D  (cm) 0.031 0 N/A A (cm) : rea 0.003 0 N/A Volume (cm) : 99.10% 100.00% N/A % Reduction in A rea: 99.20% 100.00% N/A % Reduction in Volume: Full Thickness Without Exposed Full Thickness Without Exposed N/A Classification: Support Structures Support Structures Medium Medium N/A Exudate Amount: Serosanguineous Serosanguineous N/A Exudate Type: red, brown red, brown N/A Exudate Color: Flat and Intact Flat and Intact N/A Wound Margin: Large (67-100%) Large (67-100%) N/A Granulation  Amount: Red, Hyper-granulation Red, Hyper-granulation N/A Granulation Quality: None Present (0%) None Present (0%) N/A Necrotic Amount: Fat Layer (Subcutaneous Tissue): Yes Fat Layer (Subcutaneous Tissue): Yes N/A Exposed Structures: Fascia: No Fascia: No Tendon: No Tendon: No Muscle: No Muscle: No Joint: No Joint: No Bone: No Bone: No None None N/A Epithelialization: Treatment Notes Electronic Signature(s) Signed: 07/21/2021 10:00:13 AM By: Geralyn Corwin DO Signed: 07/21/2021 6:08:32 PM By: Fonnie Mu RN Entered By: Geralyn Corwin on 07/21/2021 09:51:05 -------------------------------------------------------------------------------- Multi-Disciplinary Care Plan Details Patient Name: Date of Service: Henry Henderson, Henry Henderson 07/21/2021 9:30 A M Medical Record Number: [id number] Patient Account Number: [id number] Date of Birth/Sex: Treating RN: [birth year] (20 y.Henry. Lucious Groves Primary Care Kennley Schwandt: PCP, NO Other Clinician: Referring Baylee Campus: Treating Renelda Kilian/Extender: Tilda Franco in Treatment: 2 Multidisciplinary Care Plan reviewed with physician Active Inactive Electronic Signature(s) Signed: 12/01/2021 12:39:41 PM By: Fonnie Mu RN Previous Signature: 07/21/2021 6:08:32 PM Version By: Fonnie Mu RN Entered By: Fonnie Mu on 08/23/2021 09:48:57 -------------------------------------------------------------------------------- Pain Assessment  Details Patient Name: Date of Service: Henry Henderson 07/21/2021 9:30 A M Medical Record Number: [id number] Patient Account Number: [id number] Date of Birth/Sex: Treating RN: May 13, 2001 (20 y.Henry. Lucious Groves Primary Care Kassim Guertin: PCP, NO Other Clinician: Referring Keonia Pasko: Treating Yahayra Geis/Extender: Tilda Franco in Treatment: 2 Active Problems Location of Pain Severity and Description of Pain Patient Has Paino No Site Locations Pain Management and Medication Current Pain Management: Electronic Signature(s) Signed: 07/21/2021 6:08:32 PM By: Fonnie Mu RN Entered By: Fonnie Mu on 07/21/2021 09:25:01 -------------------------------------------------------------------------------- Patient/Caregiver Education Details Patient Name: Date of Service: Henry Henderson 9/8/2022andnbsp9:30 A M Medical Record Number: [id number] Patient Account Number: [id number] Date of Birth/Gender: Treating RN: [birth year] (20 y.Henry. Lucious Groves Primary Care Physician: PCP, NO Other Clinician: Referring Physician: Treating Physician/Extender: Tilda Franco in Treatment: 2 Education Assessment Education Provided To: Patient Education Topics Provided Wound/Skin Impairment: Methods: Explain/Verbal Responses: State content correctly Electronic Signature(s) Signed: 07/21/2021 6:08:32 PM By: Fonnie Mu RN Entered By: Fonnie Mu on 07/21/2021 09:37:12 -------------------------------------------------------------------------------- Wound Assessment Details Patient Name: Date of Service: Henry Henderson 07/21/2021 9:30 A M Medical Record Number: [id number] Patient Account Number: [id number] Date of Birth/Sex: Treating RN: 03/05/2001 (20 y.Henry. Charlean Merl, Lauren Primary Care Jossalin Chervenak: PCP, NO Other Clinician: Referring Huan Pollok: Treating Shruthi Northrup/Extender: Tilda Franco in Treatment: 2 Wound Status Wound Number: 1 Primary  Etiology: Trauma, Other Wound Location: Right, Anterior Knee Wound Status: Open Wounding Event: Trauma Date Acquired: 05/19/2021 Weeks Of Treatment: 2 Clustered Wound: No Photos Wound Measurements Length: (cm) 0.2 Width: (cm) 0.2 Depth: (cm) 0.1 Area: (cm) 0.031 Volume: (cm) 0.003 % Reduction in Area: 99.1% % Reduction in Volume: 99.2% Epithelialization: None Tunneling: No Undermining: No Wound Description Classification: Full Thickness Without Exposed Support Structures Wound Margin: Flat and Intact Exudate Amount: Medium Exudate Type: Serosanguineous Exudate Color: red, brown Foul Odor After Cleansing: No Slough/Fibrino No Wound Bed Granulation Amount: Large (67-100%) Exposed Structure Granulation Quality: Red, Hyper-granulation Fascia Exposed: No Necrotic Amount: None Present (0%) Fat Layer (Subcutaneous Tissue) Exposed: Yes Tendon Exposed: No Muscle Exposed: No Joint Exposed: No Bone Exposed: No Electronic Signature(s) Signed: 07/21/2021 6:08:32 PM By: Fonnie Mu RN Entered By: Fonnie Mu on 07/21/2021 09:31:52 -------------------------------------------------------------------------------- Wound Assessment Details Patient Name: Date of Service: Henry Henderson 07/21/2021 9:30 A M Medical Record Number: [id number] Patient Account Number: [id number] Date of Birth/Sex: Treating RN: 2001/09/12 (20 y.Henry. Lucious Groves Primary Care Norena Bratton: PCP, NO  Other Clinician: Referring Mallarie Voorhies: Treating Eidan Muellner/Extender: Tilda Franco in Treatment: 2 Wound Status Wound Number: 2 Primary Etiology: Trauma, Other Wound Location: Right, Medial Knee Wound Status: Healed - Epithelialized Wounding Event: Trauma Date Acquired: 05/19/2021 Weeks Of Treatment: 2 Clustered Wound: No Photos Wound Measurements Length: (cm) Width: (cm) Depth: (cm) Area: (cm) Volume: (cm) 0 % Reduction in Area: 100% 0 % Reduction in Volume: 100% 0  Epithelialization: None 0 Tunneling: No 0 Undermining: No Wound Description Classification: Full Thickness Without Exposed Support Structures Wound Margin: Flat and Intact Exudate Amount: Medium Exudate Type: Serosanguineous Exudate Color: red, brown Foul Odor After Cleansing: No Slough/Fibrino No Wound Bed Granulation Amount: Large (67-100%) Exposed Structure Granulation Quality: Red, Hyper-granulation Fascia Exposed: No Necrotic Amount: None Present (0%) Fat Layer (Subcutaneous Tissue) Exposed: Yes Tendon Exposed: No Muscle Exposed: No Joint Exposed: No Bone Exposed: No Treatment Notes Wound #2 (Knee) Wound Laterality: Right, Medial Cleanser Peri-Wound Care Topical Primary Dressing Secondary Dressing Secured With Compression Wrap Compression Stockings Add-Ons Electronic Signature(s) Signed: 07/21/2021 6:08:32 PM By: Fonnie Mu RN Entered By: Fonnie Mu on 07/21/2021 09:44:14 -------------------------------------------------------------------------------- Vitals Details Patient Name: Date of Service: Henry Henderson, Henry Henderson 07/21/2021 9:30 A M Medical Record Number: [id number] Patient Account Number: [id number] Date of Birth/Sex: Treating RN: [birth year] (20 y.Henry. Charlean Merl, Lauren Primary Care Neya Creegan: PCP, NO Other Clinician: Referring Bettye Sitton: Treating Yuki Brunsman/Extender: Tilda Franco in Treatment: 2 Vital Signs Time Taken: 09:24 Temperature (F): 98.4 Height (in): 72 Pulse (bpm): 60 Respiratory Rate (breaths/min): 17 Blood Pressure (mmHg): 96/64 Reference Range: 80 - 120 mg / dl Electronic Signature(s) Signed: 07/21/2021 6:08:32 PM By: Fonnie Mu RN Entered By: Fonnie Mu on 07/21/2021 09:24:45
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
## 2021-08-02 ENCOUNTER — Encounter: Payer: Self-pay | Admitting: Physical Therapy

## 2021-08-02 ENCOUNTER — Ambulatory Visit: Payer: Medicaid Other | Admitting: Physical Therapy

## 2021-08-02 ENCOUNTER — Other Ambulatory Visit: Payer: Self-pay

## 2021-08-02 DIAGNOSIS — M25661 Stiffness of right knee, not elsewhere classified: Secondary | ICD-10-CM | POA: Diagnosis not present

## 2021-08-02 DIAGNOSIS — R262 Difficulty in walking, not elsewhere classified: Secondary | ICD-10-CM | POA: Insufficient documentation

## 2021-08-02 DIAGNOSIS — R600 Localized edema: Secondary | ICD-10-CM

## 2021-08-02 DIAGNOSIS — M25561 Pain in right knee: Secondary | ICD-10-CM | POA: Insufficient documentation

## 2021-08-02 NOTE — Patient Instructions (Addendum)
     Access Code: [email or web address] URL: [email or web address] Date: 08/02/2021 Prepared by: Ruben Im  Exercises Hip and Knee Extension and Flexion Caregiver PROM - 1 x daily - 7 x weekly - 1 sets - 10 reps Seated Heel Slide - 1 x daily - 7 x weekly - 1 sets - 10 reps Sidelying Bent Knee Hip Flexion - 1 x daily - 7 x weekly - 1 sets - 10 reps Standing Knee Flexion Stretch on Step - 1 x daily - 7 x weekly - 1 sets - 10 reps   [facility] Welcome to [facility]! Here you will find all the information you will need regarding your pool therapy. If you have further questions at any time, please call our office at [phone]. After completing your initial evaluation in the [place] clinic, you may be eligible to complete a portion of your therapy in the pool. A typical week of therapy will consist of 1-2 typical physical therapy visits at our [place] location and an additional session of therapy in the pool located at the [facility] at [facility]. [address], [place]. The phone number at the pool site is [phone]. Please call this number if you are running late or need to cancel your appointment.  Aquatic therapy will be offered on Friday afternoons. Each session will last approximately 45 minutes. All scheduling and payments for aquatic therapy sessions, including cancelations, will be done through our [place] location.  To be eligible for aquatic therapy, these criteria must be met: You must be able to independently change in the locker room and get to the pool deck. A caregiver can come with you to help if needed. There are bleachers for a caregiver to sit on next to the pool. No one with an open wound is permitted in the pool.  Handicap parking is available in the front and there is a drop off option for even closer accessibility. Please arrive 15 minutes prior to your appointment to prepare for  your pool session. You must sign in at the front desk upon your arrival. Please be sure to attend to any toileting needs prior to entering the pool. [facility] rooms for changing are located to the right of the check-in desk. There is direct access to the pool deck from the locker room. You can lock your belongings in a locker but must bring a lock. Your therapist will greet you on the pool deck. There may be other swimmers in the pool at the same time but your session is one-on-one with the therapist.
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
## 2021-08-03 ENCOUNTER — Encounter: Payer: Self-pay | Admitting: Physical Therapy

## 2021-08-03 ENCOUNTER — Ambulatory Visit: Payer: Medicaid Other | Admitting: Physical Therapy

## 2021-08-03 DIAGNOSIS — M25661 Stiffness of right knee, not elsewhere classified: Secondary | ICD-10-CM | POA: Diagnosis not present

## 2021-08-03 DIAGNOSIS — R262 Difficulty in walking, not elsewhere classified: Secondary | ICD-10-CM

## 2021-08-03 DIAGNOSIS — M25561 Pain in right knee: Secondary | ICD-10-CM

## 2021-08-03 DIAGNOSIS — R600 Localized edema: Secondary | ICD-10-CM
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
## 2021-08-04 ENCOUNTER — Encounter (HOSPITAL_BASED_OUTPATIENT_CLINIC_OR_DEPARTMENT_OTHER): Payer: Medicaid Other | Admitting: Internal Medicine

## 2021-08-09 ENCOUNTER — Ambulatory Visit: Payer: Medicaid Other | Admitting: Physical Therapy

## 2021-08-12 ENCOUNTER — Encounter (HOSPITAL_BASED_OUTPATIENT_CLINIC_OR_DEPARTMENT_OTHER): Payer: Medicaid Other | Admitting: Internal Medicine

## 2021-08-12 ENCOUNTER — Ambulatory Visit: Payer: Medicaid Other | Admitting: Physical Therapy

## 2021-08-17 ENCOUNTER — Ambulatory Visit: Payer: Medicaid Other | Attending: Internal Medicine | Admitting: Physical Therapy

## 2021-08-17 DIAGNOSIS — R600 Localized edema: Secondary | ICD-10-CM | POA: Insufficient documentation

## 2021-08-17 DIAGNOSIS — M25661 Stiffness of right knee, not elsewhere classified: Secondary | ICD-10-CM | POA: Insufficient documentation

## 2021-08-17 DIAGNOSIS — M25561 Pain in right knee: Secondary | ICD-10-CM | POA: Insufficient documentation

## 2021-08-17 DIAGNOSIS — R262 Difficulty in walking, not elsewhere classified: Secondary | ICD-10-CM | POA: Insufficient documentation

## 2021-08-18 ENCOUNTER — Encounter (HOSPITAL_BASED_OUTPATIENT_CLINIC_OR_DEPARTMENT_OTHER): Payer: Medicaid Other | Admitting: Internal Medicine

## 2021-08-18 ENCOUNTER — Ambulatory Visit: Payer: Medicaid Other | Admitting: Physical Therapy

## 2021-08-24 ENCOUNTER — Encounter: Payer: Medicaid Other | Admitting: Physical Therapy

## 2021-08-26 ENCOUNTER — Ambulatory Visit: Payer: Medicaid Other | Admitting: Physical Therapy

## 2021-08-30 ENCOUNTER — Encounter: Payer: Medicaid Other | Admitting: Physical Therapy

## 2021-08-31 ENCOUNTER — Ambulatory Visit: Payer: Medicaid Other | Admitting: Physical Therapy

## 2021-09-06 ENCOUNTER — Encounter: Payer: Medicaid Other | Admitting: Physical Therapy

## 2022-02-17 ENCOUNTER — Ambulatory Visit (HOSPITAL_COMMUNITY): Payer: Self-pay

## 2022-02-17 ENCOUNTER — Emergency Department (HOSPITAL_COMMUNITY)
Admission: EM | Admit: 2022-02-17 | Discharge: 2022-02-17 | Discharge status: Against Medical Advice | Payer: Medicaid Other

## 2022-02-17 NOTE — ED Notes (Signed)
PT decided to leave AMA 14:35 ?

## 2022-03-13 ENCOUNTER — Encounter (HOSPITAL_COMMUNITY): Payer: Self-pay

## 2022-03-13 ENCOUNTER — Ambulatory Visit (HOSPITAL_COMMUNITY)
Admission: EM | Admit: 2022-03-13 | Discharge: 2022-03-13 | Disposition: A | Discharge status: Home / Self Care | Payer: Medicaid Other | Attending: Nurse Practitioner | Admitting: Nurse Practitioner

## 2022-03-13 DIAGNOSIS — N489 Disorder of penis, unspecified: Secondary | ICD-10-CM | POA: Insufficient documentation

## 2022-03-13 MED ORDER — VALACYCLOVIR HCL 1 G PO TABS: 1000.0000 mg | ORAL_TABLET | Freq: Two times a day (BID) | ORAL | 0 refills | Status: AC

## 2022-03-13 NOTE — ED Provider Notes (Addendum)
?[facility] ? ? ? ?CSN: [id number] ?Arrival date & time: 03/13/22  1548 ? ? ?  ? ?History   ?Chief Complaint ?Chief Complaint  ?Patient presents with  ? S74.5  ? ? ?HPI ?Henry Henderson is a 21 y.o. male.  ? ?Patient presents with 2-day history of bumps on his penis.  He reports they are painful and only itchy when he is sweating.  He denies any drainage from the bumps.  He is having some swelling in the right side of his groin.  He denies fevers, nausea/vomiting, dysuria, discharge from his penis.  He is currently sexually active.  Denies any known exposure to herpes virus. ? ? ?History reviewed. No pertinent past medical history. ? ?There are no problems to display for this patient. ? ? ?History reviewed. No pertinent surgical history. ? ? ? ? ?Home Medications   ? ?Prior to Admission medications   ?Medication Sig Start Date End Date Taking? Authorizing Provider  ?valACYclovir (VALTREX) 1000 MG tablet Take 1 tablet (1,000 mg total) by mouth 2 (two) times daily for 10 days. 03/13/22 03/23/22 Yes Valentino Nose, NP  ? ? ?Family History ?Family History  ?Problem Relation Age of Onset  ? Healthy Father   ? ? ?Social History ?Social History  ? ?Tobacco Use  ? Smoking status: Some Days  ?  Types: Cigars  ? Smokeless tobacco: Never  ?Substance Use Topics  ? Alcohol use: Yes  ? Drug use: Never  ? ? ? ?Allergies   ?Patient has no known allergies. ? ? ?Review of Systems ?Review of Systems ?Per HPI ? ?Physical Exam ?Triage Vital Signs ?ED Triage Vitals  ?Enc Vitals Group  ?   BP 03/13/22 1656 120/65  ?   Pulse Rate 03/13/22 1656 62  ?   Resp 03/13/22 1656 18  ?   Temp 03/13/22 1656 98.3 ?F (36.8 ?C)  ?   Temp Source 03/13/22 1656 Oral  ?   SpO2 03/13/22 1656 98 %  ?   Weight --   ?   Height --   ?   Head Circumference --   ?   Peak Flow --   ?   Pain Score 03/13/22 1655 2  ?   Pain Loc --   ?   Pain Edu? --   ?   Excl. in GC? --   ? ?No data found. ? ?Updated Vital Signs ?BP 120/65 (BP Location: Right Arm)   Pulse  62   Temp 98.3 ?F (36.8 ?C) (Oral)   Resp 18   SpO2 98%  ? ?Visual Acuity ?Right Eye Distance:   ?Left Eye Distance:   ?Bilateral Distance:   ? ?Right Eye Near:   ?Left Eye Near:    ?Bilateral Near:    ? ?Physical Exam ?Vitals and nursing note reviewed. Exam conducted with a chaperone present Eliezer Lofts, Char[employer] fundraiser).  ?Constitutional:   ?   General: He is not in acute distress. ?   Appearance: Normal appearance. He is not toxic-appearing.  ?Pulmonary:  ?   Effort: Pulmonary effort is normal. No respiratory distress.  ?Genitourinary: ?   Pubic Area: No rash.   ?   Penis: Circumcised.   ?   Testes: Normal.  ?   Epididymis:  ?   Right: Normal.  ?   Left: Normal.  ?Lymphadenopathy:  ?   Lower Body: Right inguinal adenopathy present. No left inguinal adenopathy.  ?Skin: ?   General: Skin is warm and dry.  ?  Capillary Refill: Capillary refill takes less than 2 seconds.  ?   Coloration: Skin is not jaundiced or pale.  ?   Findings: Lesion present.  ?   Comments: Multiple circular vesicular lesions to shaft of penis; slightly erythematous.  No active drainage or oozing.  ?Neurological:  ?   Mental Status: He is alert and oriented to person, place, and time.  ?   Motor: No weakness.  ?   Gait: Gait normal.  ?Psychiatric:     ?   Behavior: Behavior is cooperative.  ? ? ? ?UC Treatments / Results  ?Labs ?(all labs ordered are listed, but only abnormal results are displayed) ?Labs Reviewed  ?HSV CULTURE AND TYPING  ?CYTOLOGY, (ORAL, ANAL, URETHRAL) ANCILLARY ONLY  ? ? ?EKG ? ? ?Radiology ?No results found. ? ?Procedures ?Procedures (including critical care time) ? ?Medications Ordered in UC ?Medications - No data to display ? ?Initial Impression / Assessment and Plan / UC Course  ?I have reviewed the triage vital signs and the nursing notes. ? ?Pertinent labs & imaging results that were available during my care of the patient were reviewed by me and considered in my medical decision making (see chart for details). ? ?   ?Examination is consistent with herpes simplex virus.  Treat empirically with Valtrex 1 g twice daily for 10 days.  HSV culture and typing sent along with urethral cytology for gonorrhea, chlamydia, trichomonas.  HIV and syphilis testing deferred today per patient request.  Encouraged condom use with every sexual encounter. ?Final Clinical Impressions(s) / UC Diagnoses  ? ?Final diagnoses:  ?Penile lesion  ? ? ? ?Discharge Instructions   ? ?  ?- We will let you know if any of the testing comes back positive ?- You can start the Valtrex to treat the lesions ?- Please use condoms with every sexual encounter to prevent spread of STI ? ? ? ?ED Prescriptions   ? ? Medication Sig Dispense Auth. Provider  ? valACYclovir (VALTREX) 1000 MG tablet Take 1 tablet (1,000 mg total) by mouth 2 (two) times daily for 10 days. 20 tablet Valentino Nose, NP  ? ?  ? ?PDMP not reviewed this encounter. ?  ?Valentino Nose, NP ?03/13/22 1816 ? ?  ?Valentino Nose, NP ?03/13/22 1818 ? ?

## 2022-03-13 NOTE — Discharge Instructions (Addendum)
-   We will let you know if any of the testing comes back positive ?- You can start the Valtrex to treat the lesions ?- Please use condoms with every sexual encounter to prevent spread of STI ?

## 2022-03-13 NOTE — ED Triage Notes (Signed)
2 day h/o bumps on penis. Pt reports the area is painful and itches only when he sweat. Denies penile discharge and dysuria. ?Has been applying cocoa butter w/o relief. No known STD exposures. ?

## 2022-03-14 LAB — CYTOLOGY, (ORAL, ANAL, URETHRAL) ANCILLARY ONLY
Chlamydia: NEGATIVE
Comment: NEGATIVE
Comment: NEGATIVE
Comment: NORMAL
Neisseria Gonorrhea: NEGATIVE
Trichomonas: NEGATIVE

## 2022-03-15 LAB — HSV CULTURE AND TYPING

## 2022-03-28 ENCOUNTER — Encounter (HOSPITAL_COMMUNITY): Payer: Self-pay

## 2022-03-28 ENCOUNTER — Ambulatory Visit (HOSPITAL_COMMUNITY)
Admission: RE | Admit: 2022-03-28 | Discharge: 2022-03-28 | Disposition: A | Discharge status: Home / Self Care | Payer: Medicaid Other | Source: Ambulatory Visit | Attending: Internal Medicine | Admitting: Internal Medicine

## 2022-03-28 VITALS — BP 118/47 | HR 78 | Temp 98.4°F

## 2022-03-28 DIAGNOSIS — A6000 Herpesviral infection of urogenital system, unspecified: Secondary | ICD-10-CM

## 2022-03-28 MED ORDER — VALACYCLOVIR HCL 1 G PO TABS: 1000.0000 mg | ORAL_TABLET | Freq: Two times a day (BID) | ORAL | 0 refills | Status: AC

## 2022-03-28 NOTE — ED Triage Notes (Signed)
Pt presents for follow/up on medications and symptoms.He is requesting a refill.  ?

## 2022-03-28 NOTE — Discharge Instructions (Addendum)
You have been prescribed another course of antiviral medication for genital herpes.  Please follow-up with primary care doctor for further evaluation and management and for suppressive therapy.  Please use condoms when having sexual intercourse. ?

## 2022-03-28 NOTE — ED Provider Notes (Signed)
2 ?[facility] ? ? ? ?CSN: [id number] ?Arrival date & time: 03/28/22  1636 ? ? ?  ? ?History   ?Chief Complaint ?Chief Complaint  ?Patient presents with  ? Follow-up  ? ? ?HPI ?Henry Henderson is a 21 y.o. male.  ? ?Patient presents for follow-up after recently testing positive for genital herpes.  Patient reports that he took the antiviral medication but lesions do not completely disappear.  Lesions improved but did not disappear per patient.  He completed the medication.  Patient is requesting refill on the medication and wants to inquire about suppressive therapy.  Patient does not have a primary care doctor. ? ? ? ?History reviewed. No pertinent past medical history. ? ?There are no problems to display for this patient. ? ? ?History reviewed. No pertinent surgical history. ? ? ? ? ?Home Medications   ? ?Prior to Admission medications   ?Medication Sig Start Date End Date Taking? Authorizing Provider  ?valACYclovir (VALTREX) 1000 MG tablet Take 1 tablet (1,000 mg total) by mouth 2 (two) times daily for 7 days. 03/28/22 04/04/22 Yes Gustavus Bryant, FNP  ? ? ?Family History ?Family History  ?Problem Relation Age of Onset  ? Healthy Father   ? ? ?Social History ?Social History  ? ?Tobacco Use  ? Smoking status: Some Days  ?  Types: Cigars  ? Smokeless tobacco: Never  ?Substance Use Topics  ? Alcohol use: Yes  ? Drug use: Never  ? ? ? ?Allergies   ?Patient has no known allergies. ? ? ?Review of Systems ?Review of Systems ?Per HPI ? ?Physical Exam ?Triage Vital Signs ?ED Triage Vitals [03/28/22 1653]  ?Enc Vitals Group  ?   BP (!) 118/47  ?   Pulse Rate 78  ?   Resp   ?   Temp 98.4 ?F (36.9 ?C)  ?   Temp Source Oral  ?   SpO2 100 %  ?   Weight   ?   Height   ?   Head Circumference   ?   Peak Flow   ?   Pain Score 0  ?   Pain Loc   ?   Pain Edu?   ?   Excl. in GC?   ? ?No data found. ? ?Updated Vital Signs ?BP (!) 118/47 (BP Location: Left Arm)   Pulse 78   Temp 98.4 ?F (36.9 ?C) (Oral)   SpO2 100%  ? ?Visual  Acuity ?Right Eye Distance:   ?Left Eye Distance:   ?Bilateral Distance:   ? ?Right Eye Near:   ?Left Eye Near:    ?Bilateral Near:    ? ?Physical Exam ?Exam conducted with a chaperone present.  ?Constitutional:   ?   General: He is not in acute distress. ?   Appearance: Normal appearance. He is not toxic-appearing or diaphoretic.  ?HENT:  ?   Head: Normocephalic and atraumatic.  ?Eyes:  ?   Extraocular Movements: Extraocular movements intact.  ?   Conjunctiva/sclera: Conjunctivae normal.  ?Pulmonary:  ?   Effort: Pulmonary effort is normal.  ?Genitourinary: ?   Testes: Normal.  ?   Comments: Multiple vesicular lesions present to shaft of penis.  No drainage noted from lesions. ?Neurological:  ?   General: No focal deficit present.  ?   Mental Status: He is alert and oriented to person, place, and time. Mental status is at baseline.  ?Psychiatric:     ?   Mood and Affect: Mood normal.     ?  Behavior: Behavior normal.     ?   Thought Content: Thought content normal.     ?   Judgment: Judgment normal.  ? ? ? ?UC Treatments / Results  ?Labs ?(all labs ordered are listed, but only abnormal results are displayed) ?Labs Reviewed - No data to display ? ?EKG ? ? ?Radiology ?No results found. ? ?Procedures ?Procedures (including critical care time) ? ?Medications Ordered in UC ?Medications - No data to display ? ?Initial Impression / Assessment and Plan / UC Course  ?I have reviewed the triage vital signs and the nursing notes. ? ?Pertinent labs & imaging results that were available during my care of the patient were reviewed by me and considered in my medical decision making (see chart for details). ? ?  ? ?Vesicular lesions consistent with genital herpes are still present on physical exam.  Will treat with another course of Valtrex to eradicate this flareup.  Discussed suppressive therapy with patient and that this is typically recommended with multiple flareups.  It was also recommended that patient be followed by PCP  for suppressive therapy.  Patient reported that he did not have PCP.  PCP appointment was set up prior to patient being discharged.  Patient was agreeable to being seen by PCP to discuss suppressive therapy further.  Discussed safe sex practices with patient.  Discussed return precautions.  Patient verbalized understanding and was agreeable plan. ?Final Clinical Impressions(s) / UC Diagnoses  ? ?Final diagnoses:  ?Genital herpes simplex, unspecified site  ? ? ? ?Discharge Instructions   ? ?  ?You have been prescribed another course of antiviral medication for genital herpes.  Please follow-up with primary care doctor for further evaluation and management and for suppressive therapy.  Please use condoms when having sexual intercourse. ? ? ? ?ED Prescriptions   ? ? Medication Sig Dispense Auth. Provider  ? valACYclovir (VALTREX) 1000 MG tablet Take 1 tablet (1,000 mg total) by mouth 2 (two) times daily for 7 days. 14 tablet Ervin Knack E, [place]  ? ?  ? ?PDMP not reviewed this encounter. ?  ?Gustavus Bryant, [place] ?03/28/22 1736 ? ?

## 2022-04-06 NOTE — Progress Notes (Signed)
Erroneous encounter-disregard

## 2022-04-14 ENCOUNTER — Encounter: Payer: Medicaid Other | Admitting: Family

## 2022-05-08 ENCOUNTER — Ambulatory Visit: Payer: Self-pay

## 2022-05-23 ENCOUNTER — Ambulatory Visit (HOSPITAL_COMMUNITY): Payer: Self-pay

## 2022-05-24 ENCOUNTER — Ambulatory Visit (HOSPITAL_COMMUNITY): Payer: Self-pay

## 2022-05-25 ENCOUNTER — Ambulatory Visit (HOSPITAL_COMMUNITY): Payer: Self-pay

## 2022-05-26 ENCOUNTER — Ambulatory Visit (HOSPITAL_COMMUNITY): Payer: Self-pay

## 2022-06-12 NOTE — Progress Notes (Signed)
Erroneous encounter-disregard

## 2022-06-22 ENCOUNTER — Encounter: Payer: Medicaid Other | Admitting: Family

## 2022-06-22 DIAGNOSIS — Z7689 Persons encountering health services in other specified circumstances: Secondary | ICD-10-CM

## 2022-07-20 ENCOUNTER — Encounter (HOSPITAL_COMMUNITY): Payer: Self-pay

## 2022-07-20 ENCOUNTER — Ambulatory Visit (HOSPITAL_COMMUNITY)
Admission: RE | Admit: 2022-07-20 | Discharge: 2022-07-20 | Disposition: A | Discharge status: Home / Self Care | Payer: Medicaid Other | Source: Ambulatory Visit | Attending: Family Medicine | Admitting: Family Medicine

## 2022-07-20 VITALS — BP 123/53 | HR 68 | Temp 97.9°F | Resp 18

## 2022-07-20 DIAGNOSIS — A6000 Herpesviral infection of urogenital system, unspecified: Secondary | ICD-10-CM | POA: Diagnosis not present

## 2022-07-20 HISTORY — DX: Herpesviral infection of urogenital system, unspecified: A60.00

## 2022-07-20 MED ORDER — VALACYCLOVIR HCL 1 G PO TABS: 1000.0000 mg | ORAL_TABLET | Freq: Two times a day (BID) | ORAL | 0 refills | Status: AC

## 2022-07-20 NOTE — Discharge Instructions (Addendum)
Take valacyclovir 1000 mg--1 tablet 2 times daily for 7 days

## 2022-07-20 NOTE — ED Triage Notes (Signed)
Pt states that he is having a genital herpes outbreak and needs his meds refill.   He no showed his PCP appt, I have given him the number to call and reschedule since I do not know their no show policy.    He states that he also has a rash on his left arms 2-3 days. He was in the woods a couple days ago.

## 2022-07-20 NOTE — ED Provider Notes (Signed)
[facility]    CSN: [id number] Arrival date & time: 07/20/22  1610      History   Chief Complaint Chief Complaint  Patient presents with   Medication Refill    HPI Henry Henderson is a 21 y.o. male.    Medication Refill  Here for a genital herpes outbreak that began about 2 days ago.  He also has a rash on his arm, but since it took me about 50 minutes to get to see him, he wanted to wait till another day to come about the rash.  He is in a rush to get to the pharmacy before the close at 6:00.  No fever or chills or abdominal pain or vomiting  Past Medical History:  Diagnosis Date   Herpes genitalia     There are no problems to display for this patient.   History reviewed. No pertinent surgical history.     Home Medications    Prior to Admission medications   Medication Sig Start Date End Date Taking? Authorizing Provider  valACYclovir (VALTREX) 1000 MG tablet Take 1 tablet (1,000 mg total) by mouth 2 (two) times daily for 7 days. 07/20/22 07/27/22 Yes Hebe Merriwether, Janace Aris, MD    Family History Family History  Problem Relation Age of Onset   Healthy Father     Social History Social History   Tobacco Use   Smoking status: Some Days    Types: Cigars   Smokeless tobacco: Never  Substance Use Topics   Alcohol use: Yes   Drug use: Yes    Types: Marijuana     Allergies   Patient has no known allergies.   Review of Systems Review of Systems   Physical Exam Triage Vital Signs ED Triage Vitals  Enc Vitals Group     BP 07/20/22 1623 (!) 123/53     Pulse Rate 07/20/22 1623 68     Resp 07/20/22 1623 18     Temp 07/20/22 1623 97.9 F (36.6 C)     Temp Source 07/20/22 1623 Oral     SpO2 07/20/22 1623 100 %     Weight --      Height --      Head Circumference --      Peak Flow --      Pain Score 07/20/22 1620 0     Pain Loc --      Pain Edu? --      Excl. in GC? --    No data found.  Updated Vital Signs BP (!) 123/53 (BP Location:  Left Arm)   Pulse 68   Temp 97.9 F (36.6 C) (Oral)   Resp 18   SpO2 100%   Visual Acuity Right Eye Distance:   Left Eye Distance:   Bilateral Distance:    Right Eye Near:   Left Eye Near:    Bilateral Near:     Physical Exam Vitals reviewed.  Constitutional:      General: He is not in acute distress.    Appearance: He is not ill-appearing, toxic-appearing or diaphoretic.  Skin:    Coloration: Skin is not jaundiced or pale.  Neurological:     Mental Status: He is alert and oriented to person, place, and time.  Psychiatric:        Behavior: Behavior normal.      UC Treatments / Results  Labs (all labs ordered are listed, but only abnormal results are displayed) Labs Reviewed - No data to display  EKG  Radiology No results found.  Procedures Procedures (including critical care time)  Medications Ordered in UC Medications - No data to display  Initial Impression / Assessment and Plan / UC Course  I have reviewed the triage vital signs and the nursing notes.  Pertinent labs & imaging results that were available during my care of the patient were reviewed by me and considered in my medical decision making (see chart for details).     Valacyclovir is sent to treat this current outbreak.  Staff did help him with how to make an appointment with his PCP Final Clinical Impressions(s) / UC Diagnoses   Final diagnoses:  Genital herpes simplex, unspecified site     Discharge Instructions      Take valacyclovir 1000 mg--1 tablet 2 times daily for 7 days    ED Prescriptions     Medication Sig Dispense Auth. Provider   valACYclovir (VALTREX) 1000 MG tablet Take 1 tablet (1,000 mg total) by mouth 2 (two) times daily for 7 days. 14 tablet Korynne Dols, Janace Aris, MD      PDMP not reviewed this encounter.   Zenia Resides, MD 07/20/22 [phone]
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
## 2023-08-04 ENCOUNTER — Encounter (HOSPITAL_COMMUNITY): Payer: Self-pay

## 2023-08-04 ENCOUNTER — Other Ambulatory Visit: Payer: Self-pay

## 2023-08-04 ENCOUNTER — Emergency Department (HOSPITAL_COMMUNITY)
Admission: EM | Admit: 2023-08-04 | Discharge: 2023-08-04 | Disposition: A | Discharge status: Home / Self Care | Payer: Medicaid Other | Attending: Emergency Medicine | Admitting: Emergency Medicine

## 2023-08-04 ENCOUNTER — Emergency Department (HOSPITAL_COMMUNITY): Payer: Medicaid Other

## 2023-08-04 DIAGNOSIS — R0602 Shortness of breath: Secondary | ICD-10-CM | POA: Insufficient documentation

## 2023-08-04 DIAGNOSIS — R079 Chest pain, unspecified: Secondary | ICD-10-CM | POA: Insufficient documentation

## 2023-08-04 DIAGNOSIS — F17290 Nicotine dependence, other tobacco product, uncomplicated: Secondary | ICD-10-CM | POA: Insufficient documentation

## 2023-08-04 LAB — BASIC METABOLIC PANEL
Anion gap: 9 (ref 5–15)
BUN: 11 mg/dL (ref 6–20)
CO2: 25 mmol/L (ref 22–32)
Calcium: 9.2 mg/dL (ref 8.9–10.3)
Chloride: 102 mmol/L (ref 98–111)
Creatinine, Ser: 1.13 mg/dL (ref 0.61–1.24)
GFR, Estimated: >60 mL/min (ref >60)
Glucose, Bld: 91 mg/dL (ref 70–99)
Potassium: 3.9 mmol/L (ref 3.5–5.1)
Sodium: 136 mmol/L (ref 135–145)

## 2023-08-04 LAB — CBC
HCT: 43.8 % (ref 39.0–52.0)
Hemoglobin: 14.6 g/dL (ref 13.0–17.0)
MCH: 31.1 pg (ref 26.0–34.0)
MCHC: 33.3 g/dL (ref 30.0–36.0)
MCV: 93.2 fL (ref 80.0–100.0)
Platelets: 251 10*3/uL (ref 150–400)
RBC: 4.7 MIL/uL (ref 4.22–5.81)
RDW: 13.3 % (ref 11.5–15.5)
WBC: 7.7 10*3/uL (ref 4.0–10.5)
nRBC: 0 % (ref 0.0–0.2)

## 2023-08-04 LAB — TROPONIN I (HIGH SENSITIVITY)
Troponin I (High Sensitivity): <2 ng/L (ref <18)
Troponin I (High Sensitivity): <2 ng/L (ref <18)

## 2023-08-04 NOTE — ED Triage Notes (Signed)
Pt arrived via [facility] from home for c/o int midsternal chest pain that radiates to left side, [id number]. Started again today

## 2023-08-04 NOTE — Discharge Instructions (Signed)
Follow-up with cardiology or primary care provider  Return for new or worsening symptoms

## 2023-08-04 NOTE — ED Provider Notes (Signed)
[place] EMERGENCY DEPARTMENT AT [facility] Provider Note   CSN: [id number] Arrival date & time: 08/04/23  1454    History  Chief Complaint  Patient presents with   Chest Pain    Henry Henderson is a 22 y.o. male here for evaluation of chest pain.  Patient states he has intermittent chest pain over the last year.  Nonexertional, nonpleuritic in nature.  Located in center of his chest.  Does not radiate to left arm, left back or jaw.  Occasionally feels short of breath when he gets it however no lightheadedness, nausea or vomiting.  No abdominal pain.  He has no current pain, did have an episode earlier today which lasted a few minutes and self resolved.  On my initial evaluation he is eating [employer] at bedside.  Lower extremity swelling, pain.  No history of PE, DVT. Does use cigars  HPI     Home Medications Prior to Admission medications   Not on File      Allergies    Patient has no known allergies.    Review of Systems   Review of Systems  Constitutional: Negative.   HENT: Negative.    Respiratory: Negative.    Cardiovascular:  Positive for chest pain. Negative for palpitations and leg swelling.  Gastrointestinal: Negative.   Genitourinary: Negative.   Musculoskeletal: Negative.  Negative for neck pain.  Skin: Negative.   Neurological: Negative.   All other systems reviewed and are negative.   Physical Exam Updated Vital Signs BP 123/63   Pulse 61   Temp 98.1 F (36.7 C) (Oral)   Resp 17   Ht 5\' 8"  (1.727 m)   Wt 46.1 kg   SpO2 100%   BMI 15.45 kg/m  Physical Exam Vitals and nursing note reviewed.  Constitutional:      General: He is not in acute distress.    Appearance: He is well-developed. He is not ill-appearing, toxic-appearing or diaphoretic.  HENT:     Head: Atraumatic.  Eyes:     Pupils: Pupils are equal, round, and reactive to light.  Cardiovascular:     Rate and Rhythm: Normal rate and regular rhythm.     Pulses:           Carotid pulses are 2+ on the right side and 2+ on the left side.      Radial pulses are 2+ on the right side and 2+ on the left side.     Heart sounds: Normal heart sounds.  Pulmonary:     Effort: Pulmonary effort is normal. No respiratory distress.     Breath sounds: Normal breath sounds.     Comments: Clear bilaterally, speaks in full sentences without difficulty Abdominal:     General: Bowel sounds are normal. There is no distension.     Palpations: Abdomen is soft.     Comments: Abd soft non tender  Musculoskeletal:        General: Normal range of motion.     Cervical back: Normal range of motion and neck supple.     Right lower leg: No tenderness. No edema.     Left lower leg: No tenderness. No edema.     Comments: No bony tenderness, compartments soft  Skin:    General: Skin is warm and dry.  Neurological:     General: No focal deficit present.     Mental Status: He is alert and oriented to person, place, and time.    ED Results / Procedures /  Treatments   Labs (all labs ordered are listed, but only abnormal results are displayed) Labs Reviewed  BASIC METABOLIC PANEL  CBC  TROPONIN I (HIGH SENSITIVITY)  TROPONIN I (HIGH SENSITIVITY)    EKG None  Radiology DG Chest 2 View  Result Date: 08/04/2023 CLINICAL DATA:  Chest pain EXAM: CHEST - 2 VIEW COMPARISON:  May 11, 2021 FINDINGS: The cardiomediastinal silhouette is normal in contour. No pleural effusion. No pneumothorax. No acute pleuroparenchymal abnormality. Visualized abdomen is unremarkable. No acute osseous abnormality noted. IMPRESSION: No acute cardiopulmonary abnormality. Electronically Signed   By: Meda Klinefelter M.D.   On: 08/04/2023 15:51    Procedures Procedures    Medications Ordered in ED Medications - No data to display  ED Course/ Medical Decision Making/ A&P   22 year old here for evaluation of chest pain.  Intermittent over the last year.  Nonexertional, nonpleuritic in nature.  No URI  symptoms.  Does not appear fluid overloaded, actively eating on my exam.  Abdomen soft, non-tender.  He is afebrile, nonseptic, not ill-appearing.  Heart and lungs clear.  PERC negative, Wells criteria low risk.  Labs and imaging personally viewed and interpreted:  CBC without leukocytosis 9 metabolic panel without significant abnormality Delta troponin negative Chest x-ray without cardiomegaly, pulm edema, pneumothorax EKG without ischemic changes  Discussed results with patient, friend in room.  At this time I low suspicion for acute ACS, PE, dissection, bacterial infectious process, pneumothorax.  The patient has been appropriately medically screened and/or stabilized in the ED. I have low suspicion for any other emergent medical condition which would require further screening, evaluation or treatment in the ED or require inpatient management.  Patient is hemodynamically stable and in no acute distress.  Patient able to ambulate in department prior to ED.  Evaluation does not show acute pathology that would require ongoing or additional emergent interventions while in the emergency department or further inpatient treatment.  I have discussed the diagnosis with the patient and answered all questions.  Pain is been managed while in the emergency department and patient has no further complaints prior to discharge.  Patient is comfortable with plan discussed in room and is stable for discharge at this time.  I have discussed strict return precautions for returning to the emergency department.  Patient was encouraged to follow-up with PCP/specialist refer to at discharge.                                 Medical Decision Making Amount and/or Complexity of Data Reviewed Independent Historian: friend External Data Reviewed: labs, radiology, ECG and notes. Labs: ordered. Decision-making details documented in ED Course. Radiology: ordered and independent interpretation performed. Decision-making  details documented in ED Course. ECG/medicine tests: ordered and independent interpretation performed. Decision-making details documented in ED Course.  Risk OTC drugs. Decision regarding hospitalization. Diagnosis or treatment significantly limited by social determinants of health.           Final Clinical Impression(s) / ED Diagnoses Final diagnoses:  Nonspecific chest pain    Rx / DC Orders ED Discharge Orders     None         Kashira Behunin A, PA-C 08/04/23 2205    Tegeler, Canary Brim, MD 08/04/23 [phone]

## 2023-08-17 ENCOUNTER — Ambulatory Visit: Payer: Self-pay | Attending: Cardiology | Admitting: Cardiology

## 2023-08-20 ENCOUNTER — Encounter: Payer: Self-pay | Admitting: Cardiology
